# Patient Record
Sex: Male | Born: 2000 | Race: Black or African American | Hispanic: No | Marital: Single | Smoking: Never smoker
Health system: Southern US, Community
[De-identification: ages and names within clinical notes are randomized; demographics above are authoritative.]

---
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
## 2020-08-19 DIAGNOSIS — R110 Nausea: Secondary | ICD-10-CM | POA: Diagnosis not present

## 2020-08-19 DIAGNOSIS — R61 Generalized hyperhidrosis: Secondary | ICD-10-CM | POA: Diagnosis not present

## 2020-08-19 DIAGNOSIS — R1010 Upper abdominal pain, unspecified: Secondary | ICD-10-CM | POA: Diagnosis not present

## 2020-08-19 DIAGNOSIS — K2970 Gastritis, unspecified, without bleeding: Secondary | ICD-10-CM | POA: Diagnosis not present

## 2022-08-31 ENCOUNTER — Encounter (HOSPITAL_COMMUNITY): Payer: Self-pay | Admitting: Emergency Medicine

## 2022-08-31 ENCOUNTER — Emergency Department (HOSPITAL_COMMUNITY): Payer: Managed Care, Other (non HMO)

## 2022-08-31 ENCOUNTER — Other Ambulatory Visit: Payer: Self-pay

## 2022-08-31 ENCOUNTER — Emergency Department (HOSPITAL_COMMUNITY)
Admission: EM | Admit: 2022-08-31 | Discharge: 2022-09-01 | Disposition: A | Discharge status: Home / Self Care | Payer: Managed Care, Other (non HMO) | Attending: Emergency Medicine | Admitting: Emergency Medicine

## 2022-08-31 DIAGNOSIS — R042 Hemoptysis: Secondary | ICD-10-CM | POA: Insufficient documentation

## 2022-08-31 DIAGNOSIS — J069 Acute upper respiratory infection, unspecified: Secondary | ICD-10-CM | POA: Insufficient documentation

## 2022-08-31 DIAGNOSIS — R051 Acute cough: Secondary | ICD-10-CM

## 2022-08-31 DIAGNOSIS — Z1152 Encounter for screening for COVID-19: Secondary | ICD-10-CM | POA: Diagnosis not present

## 2022-08-31 DIAGNOSIS — R0602 Shortness of breath: Secondary | ICD-10-CM

## 2022-08-31 NOTE — ED Provider Triage Note (Signed)
Emergency Medicine Provider Triage Evaluation Note  Cole Medina , a 22 y.o. male  was evaluated in triage.  Pt complains of cough for 3 weeks.  Pt had a fever and cough   Review of Systems  Positive: Coughing up blood Negative: fever  Physical Exam  BP (!) 149/70 (BP Location: Right Wrist)   Pulse 95   Temp 98.9 F (37.2 C) (Oral)   Resp 16   Ht 5\' 11"  (1.803 m)   Wt (!) 154.2 kg   SpO2 99%   BMI 47.42 kg/m  Gen:   Awake, no distress   Resp:  Normal effort  MSK:   Moves extremities without difficulty  Other:    Medical Decision Making  Medically screening exam initiated at 9:40 PM.  Appropriate orders placed.  Cole Medina was informed that the remainder of the evaluation will be completed by another provider, this initial triage assessment does not replace that evaluation, and the importance of remaining in the ED until their evaluation is complete.     Fransico Meadow, [place] 08/31/22 [id number]

## 2022-08-31 NOTE — ED Triage Notes (Signed)
Pt has been experiencing cough with spots of blood for the past week. Pt states for the past month he has a had a cough, fever, chills and shortness of breath for the past month.

## 2022-09-01 LAB — CBC
HCT: 41.6 % (ref 39.0–52.0)
Hemoglobin: 13.4 g/dL (ref 13.0–17.0)
MCH: 28.6 pg (ref 26.0–34.0)
MCHC: 32.2 g/dL (ref 30.0–36.0)
MCV: 88.7 fL (ref 80.0–100.0)
Platelets: 306 10*3/uL (ref 150–400)
RBC: 4.69 MIL/uL (ref 4.22–5.81)
RDW: 12.3 % (ref 11.5–15.5)
WBC: 7.9 10*3/uL (ref 4.0–10.5)
nRBC: 0 % (ref 0.0–0.2)

## 2022-09-01 LAB — RESP PANEL BY RT-PCR (RSV, FLU A&B, COVID)  RVPGX2
Influenza A by PCR: NEGATIVE
Influenza B by PCR: NEGATIVE
Resp Syncytial Virus by PCR: NEGATIVE
SARS Coronavirus 2 by RT PCR: NEGATIVE

## 2022-09-01 LAB — BASIC METABOLIC PANEL
Anion gap: 11 (ref 5–15)
BUN: 11 mg/dL (ref 6–20)
CO2: 24 mmol/L (ref 22–32)
Calcium: 9.1 mg/dL (ref 8.9–10.3)
Chloride: 103 mmol/L (ref 98–111)
Creatinine, Ser: 1.15 mg/dL (ref 0.61–1.24)
GFR, Estimated: >60 mL/min (ref >60)
Glucose, Bld: 88 mg/dL (ref 70–99)
Potassium: 3.9 mmol/L (ref 3.5–5.1)
Sodium: 138 mmol/L (ref 135–145)

## 2022-09-01 LAB — PROTIME-INR
INR: 1 (ref 0.8–1.2)
Prothrombin Time: 13.5 seconds (ref 11.4–15.2)

## 2022-09-01 LAB — TROPONIN I (HIGH SENSITIVITY): Troponin I (High Sensitivity): 5 ng/L (ref <18)

## 2022-09-01 LAB — BRAIN NATRIURETIC PEPTIDE: B Natriuretic Peptide: 10.6 pg/mL (ref 0.0–100.0)

## 2022-09-01 LAB — HEPATIC FUNCTION PANEL
ALT: 19 U/L (ref 0–44)
AST: 20 U/L (ref 15–41)
Albumin: 3.7 g/dL (ref 3.5–5.0)
Alkaline Phosphatase: 51 U/L (ref 38–126)
Bilirubin, Direct: <0.1 mg/dL (ref 0.0–0.2)
Total Bilirubin: 0.5 mg/dL (ref 0.3–1.2)
Total Protein: 8.2 g/dL — ABNORMAL HIGH (ref 6.5–8.1)

## 2022-09-01 LAB — D-DIMER, QUANTITATIVE: D-Dimer, Quant: 0.34 ug/mL-FEU (ref 0.00–0.50)

## 2022-09-01 LAB — LIPASE, BLOOD: Lipase: 31 U/L (ref 11–51)

## 2022-09-01 NOTE — ED Notes (Signed)
Contacted lab regarding blood sent still not in process. Lab states they are working on it and they did receive the blood.

## 2022-09-01 NOTE — ED Provider Notes (Signed)
[facility] EMERGENCY DEPARTMENT Provider Note   CSN: [id number] Arrival date & time: 08/31/22  2035     History  Chief Complaint  Patient presents with   Hemoptysis    Cole Medina is a 22 y.o. male.  The history is provided by the patient and medical records. No language interpreter was used.  Cough Cough characteristics:  Productive Sputum characteristics:  Bloody and clear Severity:  Moderate Onset quality:  Gradual Duration:  3 weeks Timing:  Constant Progression:  Waxing and waning Chronicity:  New Context: sick contacts (girlfriend had covid)   Relieved by:  Nothing Worsened by:  Nothing Ineffective treatments:  None tried Associated symptoms: chest pain (tightness), chills, fever and shortness of breath   Associated symptoms: no diaphoresis, no headaches, no rash, no rhinorrhea, no sore throat and no wheezing   Shortness of Breath Severity:  Moderate Onset quality:  Gradual Duration:  3 weeks Timing:  Intermittent Progression:  Waxing and waning Chronicity:  New Context: URI   Relieved by:  Rest Worsened by:  Deep breathing, coughing and exertion Ineffective treatments:  None tried Associated symptoms: chest pain (tightness), cough, fever, hemoptysis, sputum production and vomiting   Associated symptoms: no abdominal pain, no diaphoresis, no headaches, no neck pain, no rash, no sore throat and no wheezing   Risk factors: no hx of PE/DVT (fam hx of)        Home Medications Prior to Admission medications   Medication Sig Start Date End Date Taking? Authorizing Provider  fluticasone (FLONASE) 50 MCG/ACT nasal spray Place 2 sprays into both nostrils daily. Patient not taking: Reported on 09/09/2014 08/20/13   Gerda Diss, DO      Allergies    Patient has no known allergies.    Review of Systems   Review of Systems  Constitutional:  Positive for appetite change, chills, fatigue and fever. Negative for diaphoresis.  HENT:   Negative for congestion, rhinorrhea and sore throat.   Respiratory:  Positive for cough, hemoptysis, sputum production, chest tightness and shortness of breath. Negative for wheezing.   Cardiovascular:  Positive for chest pain (tightness). Negative for palpitations and leg swelling.  Gastrointestinal:  Positive for nausea and vomiting. Negative for abdominal pain, constipation and diarrhea.  Genitourinary:  Negative for dysuria and frequency.  Musculoskeletal:  Negative for back pain, neck pain and neck stiffness.  Skin:  Negative for rash and wound.  Neurological:  Negative for headaches.  Psychiatric/Behavioral:  Negative for agitation and confusion.   All other systems reviewed and are negative.   Physical Exam Updated Vital Signs BP (!) 147/81   Pulse 91   Temp 99.1 F (37.3 C)   Resp 16   Ht 5\' 11"  (1.803 m)   Wt (!) 154.2 kg   SpO2 96%   BMI 47.42 kg/m  Physical Exam Vitals and nursing note reviewed.  Constitutional:      General: He is not in acute distress.    Appearance: He is well-developed. He is not ill-appearing, toxic-appearing or diaphoretic.  HENT:     Head: Normocephalic and atraumatic.     Nose: No congestion or rhinorrhea.     Mouth/Throat:     Mouth: Mucous membranes are moist.     Pharynx: No oropharyngeal exudate or posterior oropharyngeal erythema.  Eyes:     Extraocular Movements: Extraocular movements intact.     Conjunctiva/sclera: Conjunctivae normal.     Pupils: Pupils are equal, round, and reactive to light.  Cardiovascular:  Rate and Rhythm: Normal rate and regular rhythm.     Heart sounds: No murmur heard. Pulmonary:     Effort: Pulmonary effort is normal. No respiratory distress.     Breath sounds: Normal breath sounds. No wheezing, rhonchi or rales.  Chest:     Chest wall: No tenderness.  Abdominal:     General: Abdomen is flat.     Palpations: Abdomen is soft.     Tenderness: There is no abdominal tenderness. There is no right CVA  tenderness, left CVA tenderness, guarding or rebound.  Musculoskeletal:        General: No swelling or tenderness.     Cervical back: Neck supple. No tenderness.     Right lower leg: No edema.     Left lower leg: No edema.  Skin:    General: Skin is warm and dry.     Capillary Refill: Capillary refill takes less than 2 seconds.     Findings: No erythema or rash.  Neurological:     General: No focal deficit present.     Mental Status: He is alert.  Psychiatric:        Mood and Affect: Mood normal.     ED Results / Procedures / Treatments   Labs (all labs ordered are listed, but only abnormal results are displayed) Labs Reviewed  HEPATIC FUNCTION PANEL - Abnormal; Notable for the following components:      Result Value   Total Protein 8.2 (*)    All other components within normal limits  RESP PANEL BY RT-PCR (RSV, FLU A&B, COVID)  RVPGX2  CBC  BASIC METABOLIC PANEL  PROTIME-INR  BRAIN NATRIURETIC PEPTIDE  D-DIMER, QUANTITATIVE  LIPASE, BLOOD  TROPONIN I (HIGH SENSITIVITY)  TROPONIN I (HIGH SENSITIVITY)    EKG EKG Interpretation  Date/Time:  Wednesday September 01 2022 02:46:11 EST Ventricular Rate:  82 PR Interval:  138 QRS Duration: 98 QT Interval:  360 QTC Calculation: 420 R Axis:   72 Text Interpretation: Normal sinus rhythm Incomplete right bundle branch block Borderline ECG When compared with ECG of 09-Sep-2014 20:22, PREVIOUS ECG IS PRESENT when comapred to prior, similar StQ3 pattern with new t wave inverison in lead 3. No STEMI Confirmed by Theda Belfast ([id number]) on 09/01/2022 8:46:49 AM  Radiology DG Chest 2 View  Result Date: 08/31/2022 CLINICAL DATA:  Cough. EXAM: CHEST - 2 VIEW COMPARISON:  Chest radiograph dated May 30, 2007 FINDINGS: The heart size and mediastinal contours are within normal limits. Both lungs are clear. The visualized skeletal structures are unremarkable. IMPRESSION: No active cardiopulmonary disease. Electronically Signed   By: Larose Hires D.O.   On: 08/31/2022 21:56    Procedures Procedures    Medications Ordered in ED Medications - No data to display  ED Course/ Medical Decision Making/ A&P                           Medical Decision Making Amount and/or Complexity of Data Reviewed Labs: ordered.    Cole Medina is a 22 y.o. male with no significant past medical history who presents with several weeks of intermittent chest tightness, exertional shortness of breath, pleuritic back discomfort, nausea, vomiting, fatigue, intermittent diaphoresis, subjective fevers, chills, and several days of new hemoptysis.  According to patient and his mother, they have a family history of heart failure, heart disease, and blood clots.  Patient says that his girlfriend had COVID several weeks ago and for the last  3 weeks to 1 month, patient has had waxing and waning symptoms of URI with cough, fevers, and chills.  He has intermittent diaphoresis and night sweats and has had a tightness in his chest and back that is pleuritic.  He reports it is also exertional.  He is having more exertional shortness of breath and cannot go up stairs like he used to.  He denies any leg pain or leg swelling and denies any personal history of blood clots or heart failure.  He denies any trauma.  Denies any rashes.  Reports some intermittent nausea and vomiting at times and has less appetite.  Denies abdominal pain.  Denies any exposure to TB but does say his girlfriend had COVID.  No other travel outside the country reported.  Does not smoke per chart.  Over the last 3 days he started having hemoptysis with coughing.  Given the change in the cough with blood he decided to come in for evaluation.  On exam, lungs were clear and chest was nontender.  Back nontender.  Abdomen nontender.  Good pulses in extremities.  Legs nontender nonedematous.  Oral exam unremarkable.  Patient resting comfortably and of note patient has been here for approximately 12 hours prior  to my initial evaluation.  EKG does not show STEMI but does show an S1/Q3/T3 pattern that does have a new T wave inversion in lead III compared to prior.    Patient had some workup starting in triage and he did not have evidence of pneumonia on chest x-ray and his INR was normal, CBC and BMP reassuring.  Given the more complex history and family history of multiple blood clots, heart failure, and possible COVID exposure, I do feel need to get a D-dimer to rule out thromboembolic disease, a troponin given the chest tightness that is exertional and pleuritic as well as exertional shortness of breath and a BNP.  Will also check in for viral illness with COVID/flu/RSV swab.  Want to make sure he does not have new heart failure or a post-COVID myocarditis.  If workup is reassuring we will feel that this is likely a viral infection and he will be safe for discharge home but will get further workup now.  Patient agrees with this plan.   Workup returned overall reassuring.  D-dimer negative.  BNP not elevated.  Troponin negative and he has been here for 16 hours I feel need a second troponin at this time.  COVID, flu, RSV test negative.  Rest of workup overall reassuring.  Patient likely viral infection causing symptoms hemoptysis.  Patient will follow-up with PCP and understood return precautions.  Will give work note for the neck several days due to suspected viral illness and he will rest.  He agrees with plan of care and was discharged in good condition with reassuring vital signs on reassessment.          Final Clinical Impression(s) / ED Diagnoses Final diagnoses:  Hemoptysis  Viral URI  Acute cough  Shortness of breath    Rx / DC Orders ED Discharge Orders     None       Clinical Impression: 1. Hemoptysis   2. Viral URI   3. Acute cough   4. Shortness of breath     Disposition: Discharge  Condition: Good  I have discussed the results, Dx and Tx plan with the pt(&  family if present). He/she/they expressed understanding and agree(s) with the plan. Discharge instructions discussed at great length. Strict return  precautions discussed and pt &/or family have verbalized understanding of the instructions. No further questions at time of discharge.    New Prescriptions   No medications on file    Follow Up: [facility] [address] [place] [address] [phone] Schedule an appointment as soon as possible for a visit    [facility] [address] [id number] [place]        Emera Bussie, Gwenyth Allegra, MD 09/01/22 1245

## 2022-09-01 NOTE — Discharge Instructions (Signed)
Your workup today was overall reassuring.  The viral testing was negative for COVID, flu, and RSV.  The x-ray did not show pneumonia.  Your BNP to look for heart failure was negative and your cardiac enzyme, troponin was negative.  Your D-dimer was negative ruling out blood clot and your workup was otherwise reassuring.  I suspect you have a viral infection again causing the symptoms and so please stay out of work for the neck several days.  Please rest and stay hydrated.  If any symptoms change or worsen acutely, please return to the nearest emergency department.
